# Patient Record
Sex: Female | Born: 1975 | Race: White | Hispanic: No | Marital: Married | Smoking: Former smoker
Health system: Southern US, Community
[De-identification: ages and names within clinical notes are randomized; demographics above are authoritative.]

## PROBLEM LIST (undated history)

## (undated) HISTORY — PX: REPLACEMENT TOTAL KNEE: SUR1224

---

## 1997-06-10 ENCOUNTER — Ambulatory Visit (HOSPITAL_BASED_OUTPATIENT_CLINIC_OR_DEPARTMENT_OTHER): Admission: RE | Admit: 1997-06-10 | Discharge: 1997-06-10 | Payer: Self-pay | Admitting: Orthopedic Surgery

## 1998-04-01 ENCOUNTER — Emergency Department (HOSPITAL_COMMUNITY): Admission: EM | Admit: 1998-04-01 | Discharge: 1998-04-01 | Payer: Self-pay | Admitting: Emergency Medicine

## 1998-04-01 ENCOUNTER — Encounter: Payer: Self-pay | Admitting: Emergency Medicine

## 1999-05-04 ENCOUNTER — Encounter: Payer: Self-pay | Admitting: Surgery

## 1999-05-05 ENCOUNTER — Inpatient Hospital Stay (HOSPITAL_COMMUNITY): Admission: EM | Admit: 1999-05-05 | Discharge: 1999-05-05 | Payer: Self-pay | Admitting: Emergency Medicine

## 1999-10-30 ENCOUNTER — Other Ambulatory Visit: Admission: RE | Admit: 1999-10-30 | Discharge: 1999-10-30 | Payer: Self-pay | Admitting: *Deleted

## 2000-09-09 ENCOUNTER — Other Ambulatory Visit: Admission: RE | Admit: 2000-09-09 | Discharge: 2000-09-09 | Payer: Self-pay | Admitting: *Deleted

## 2001-12-10 ENCOUNTER — Other Ambulatory Visit: Admission: RE | Admit: 2001-12-10 | Discharge: 2001-12-10 | Payer: Self-pay | Admitting: *Deleted

## 2001-12-12 ENCOUNTER — Encounter: Payer: Self-pay | Admitting: Family Medicine

## 2001-12-12 ENCOUNTER — Encounter: Admission: RE | Admit: 2001-12-12 | Discharge: 2001-12-12 | Payer: Self-pay | Admitting: Family Medicine

## 2002-01-13 ENCOUNTER — Ambulatory Visit (HOSPITAL_COMMUNITY): Admission: RE | Admit: 2002-01-13 | Discharge: 2002-01-13 | Payer: Self-pay | Admitting: Gastroenterology

## 2002-01-13 ENCOUNTER — Encounter: Payer: Self-pay | Admitting: Gastroenterology

## 2002-02-09 ENCOUNTER — Encounter: Payer: Self-pay | Admitting: Family Medicine

## 2002-02-09 ENCOUNTER — Encounter: Admission: RE | Admit: 2002-02-09 | Discharge: 2002-02-09 | Payer: Self-pay | Admitting: Family Medicine

## 2002-02-11 ENCOUNTER — Ambulatory Visit (HOSPITAL_COMMUNITY): Admission: RE | Admit: 2002-02-11 | Discharge: 2002-02-11 | Payer: Self-pay | Admitting: Gastroenterology

## 2002-02-11 ENCOUNTER — Encounter (INDEPENDENT_AMBULATORY_CARE_PROVIDER_SITE_OTHER): Payer: Self-pay | Admitting: *Deleted

## 2002-08-27 ENCOUNTER — Encounter: Payer: Self-pay | Admitting: Surgery

## 2002-08-27 ENCOUNTER — Encounter (INDEPENDENT_AMBULATORY_CARE_PROVIDER_SITE_OTHER): Payer: Self-pay

## 2002-08-27 ENCOUNTER — Observation Stay (HOSPITAL_COMMUNITY): Admission: RE | Admit: 2002-08-27 | Discharge: 2002-08-28 | Payer: Self-pay | Admitting: Surgery

## 2003-06-04 ENCOUNTER — Other Ambulatory Visit: Admission: RE | Admit: 2003-06-04 | Discharge: 2003-06-04 | Payer: Self-pay | Admitting: Family Medicine

## 2004-01-23 ENCOUNTER — Inpatient Hospital Stay (HOSPITAL_COMMUNITY): Admission: AD | Admit: 2004-01-23 | Discharge: 2004-01-23 | Payer: Self-pay | Admitting: *Deleted

## 2004-08-25 ENCOUNTER — Inpatient Hospital Stay (HOSPITAL_COMMUNITY): Admission: RE | Admit: 2004-08-25 | Discharge: 2004-08-28 | Payer: Self-pay | Admitting: Obstetrics and Gynecology

## 2009-11-16 ENCOUNTER — Inpatient Hospital Stay (HOSPITAL_COMMUNITY)
Admission: AD | Admit: 2009-11-16 | Discharge: 2009-11-16 | Payer: Self-pay | Source: Home / Self Care | Admitting: Obstetrics and Gynecology

## 2010-04-12 LAB — WET PREP, GENITAL
Clue Cells Wet Prep HPF POC: NONE SEEN
Trich, Wet Prep: NONE SEEN
Yeast Wet Prep HPF POC: NONE SEEN

## 2010-04-12 LAB — ABO/RH: ABO/RH(D): O POS

## 2010-04-12 LAB — GC/CHLAMYDIA PROBE AMP, GENITAL
Chlamydia, DNA Probe: NEGATIVE
GC Probe Amp, Genital: NEGATIVE

## 2010-04-12 LAB — URINALYSIS, ROUTINE W REFLEX MICROSCOPIC
Bilirubin Urine: NEGATIVE
Glucose, UA: NEGATIVE mg/dL
Hgb urine dipstick: NEGATIVE
Ketones, ur: NEGATIVE mg/dL
Nitrite: NEGATIVE
Protein, ur: NEGATIVE mg/dL
Specific Gravity, Urine: <1.005 — ABNORMAL LOW (ref 1.005–1.030)
Urobilinogen, UA: 0.2 mg/dL (ref 0.0–1.0)
pH: 7 (ref 5.0–8.0)

## 2010-04-12 LAB — CBC
HCT: 41 % (ref 36.0–46.0)
Hemoglobin: 13.9 g/dL (ref 12.0–15.0)
MCH: 29.7 pg (ref 26.0–34.0)
MCHC: 34 g/dL (ref 30.0–36.0)
MCV: 87.4 fL (ref 78.0–100.0)
Platelets: 262 10*3/uL (ref 150–400)
RBC: 4.69 MIL/uL (ref 3.87–5.11)
RDW: 13.4 % (ref 11.5–15.5)
WBC: 9.8 10*3/uL (ref 4.0–10.5)

## 2010-04-12 LAB — HCG, QUANTITATIVE, PREGNANCY: hCG, Beta Chain, Quant, S: 16535 m[IU]/mL — ABNORMAL HIGH (ref <5)

## 2010-04-12 LAB — POCT PREGNANCY, URINE: Preg Test, Ur: POSITIVE

## 2010-04-26 ENCOUNTER — Observation Stay (HOSPITAL_COMMUNITY)
Admission: AD | Admit: 2010-04-26 | Discharge: 2010-04-26 | Disposition: A | Discharge status: Home / Self Care | Payer: Medicaid Other | Source: Ambulatory Visit | Attending: Obstetrics and Gynecology | Admitting: Obstetrics and Gynecology

## 2010-04-26 DIAGNOSIS — Y92410 Unspecified street and highway as the place of occurrence of the external cause: Secondary | ICD-10-CM | POA: Insufficient documentation

## 2010-04-26 DIAGNOSIS — O99891 Other specified diseases and conditions complicating pregnancy: Principal | ICD-10-CM | POA: Insufficient documentation

## 2010-05-03 ENCOUNTER — Encounter: Payer: Medicaid Other | Attending: Obstetrics and Gynecology

## 2010-05-03 DIAGNOSIS — Z713 Dietary counseling and surveillance: Secondary | ICD-10-CM | POA: Insufficient documentation

## 2010-05-03 DIAGNOSIS — O99810 Abnormal glucose complicating pregnancy: Secondary | ICD-10-CM | POA: Insufficient documentation

## 2010-06-16 NOTE — Op Note (Signed)
NAME:  Denise Cross, Denise Cross                      ACCOUNT NO.:  [id number]   MEDICAL RECORD NO.:  [id number]                   PATIENT TYPE:  AMB   LOCATION:  DAY                                  FACILITY:  [facility]   PHYSICIAN:  Abigail Miyamoto, M.D.              DATE OF BIRTH:  08/26/1975   DATE OF PROCEDURE:  08/27/2002  DATE OF DISCHARGE:                                 OPERATIVE REPORT   PREOPERATIVE DIAGNOSES:  1. Biliary dyskinesia.  2. Right ovarian cyst.   POSTOPERATIVE DIAGNOSES:  1. Biliary dyskinesia.  2. Benign-appearing right ovary.   PROCEDURE:  1. Diagnostic laparoscopy.  2. Laparoscopic cholecystectomy with intraoperative cholangiogram.   SURGEON:  Douglas A. Magnus Ivan, M.D.   ANESTHESIA:  General endotracheal.   ESTIMATED BLOOD LOSS:  Minimal.   INDICATIONS:  Denise Cross is a 35 year old female, who has been  experiencing right upper quadrant abdominal pain and nausea and vomiting.  She has had a work-up which reveals her to have biliary dyskinesia.  She has  also had  history of ovarian cysts in the past and recently had a large 4 cm  right ovarian cyst.  Therefore, the decision was made to proceed to the  operating room for diagnostic laparoscopy and cholecystectomy.   FINDINGS:  The patient was found to have both a normal-appearing right and  left ovary with a normal-appearing uterus and no scar tissue or  abnormalities in the pelvis.  The cholangiogram was normal.   PROCEDURE IN DETAIL:  The patient was brought to the operating room and  identified as Denise Cross.  She was placed supine on the operating  table, and general anesthesia was induced.  Her abdomen was then prepped and  draped in the usual sterile fashion.  Using a #15 blade, a small transverse  incision was made below the umbilicus.  The incision was carried down  through the fascia with a scalpel.  Hemostat was then used to pass through  the peritoneal cavity.  Next, a 0 Vicryl  pursestring suture was placed  around the fascial opening.  The Hasson port was placed through the opening,  and insufflation of the abdomen was begun.  Next, a 12 mm port was placed in  the patient's epigastrium, and two 5 mm ports were placed in the patient's  right flank under direct vision.  The patient was then placed in the  Trendelenburg position, and the pelvis was examined.  The patient's uterus  appeared normal.  The right ovary was the elevated and closely examined.  Very tiny corpus luteal cysts were identified but no large ovarian cyst was  identified on any part of the right ovary.  The left ovary was also examined  and was normal as well.  The adnexa also appeared normal.  There was no scar  tissue seen in the pelvis.  At this point, the patient was placed back in  the correct position for  cholecystectomy.  The gallbladder was identified,  and multiple adhesions to the gallbladder were taken down bluntly.  The  cystic duct was then identified and clipped once distally and then partly  opened with laparoscopic scissors.  A cholangiocatheter was then inserted  into the right upper quadrant through an angiocatheter.  The  cholangiocatheter was then placed into the opening of the cystic duct.  A  cholangiogram was then performed with contrast under direct fluoroscopy.  Good flow of contrast was seen into the entire biliary system and duodenum.  The cholangiocatheter was then removed.  The cystic duct was then clipped  three times proximally and transected with the scissors.  The cystic artery  was identified and clipped several times proximally and distally before  being transected as well.  The gallbladder was then slowly dissected free  from the liver bed with the electrocautery.  Once the gallbladder was freed  from the liver bed, it was pulled out through the incision at the umbilicus.  The liver bed was again examined, and hemostasis appeared to be achieved.  The abdomen was  then irrigated with normal saline.  All ports were then  removed under direct vision, and the abdomen was deflated.  All incisions  were then anesthetized with 0.25% Marcaine and then closed with 4-0 Monocryl  subcuticular sutures.  Steri-Strips, gauze, and tape were then applied.  The  patient tolerated the procedure well.  All sponge, needle, and instrument  counts were correct at the end of the procedure.  The patient was then  extubated in the operating room and taken in stable condition to the  recovery room.                                               Abigail Miyamoto, M.D.    DB/MEDQ  D:  08/27/2002  T:  08/27/2002  Job:  [id number]   cc:   Lenoard Aden, M.D.  [address], [address]  [place]  [place] [address]  Fax: [phone]   Anselmo Rod, M.D.  [address]  [address]  [place]  [place] [address]  Fax: [phone]

## 2010-06-16 NOTE — Op Note (Signed)
NAMEROBERT, Denise Cross NO.:  [id number]   MEDICAL RECORD NO.:  [id number]          PATIENT TYPE:  INP   LOCATION:  9199                          FACILITY:  [facility]   PHYSICIAN:  Lenoard Aden, M.D.DATE OF BIRTH:  1975-04-01   DATE OF PROCEDURE:  08/25/2004  DATE OF DISCHARGE:                                 OPERATIVE REPORT   PREOPERATIVE DIAGNOSIS:  38-week intrauterine pregnancy, polyhydramnios,  presumed macrosomia.   POSTOPERATIVE DIAGNOSIS:  38-week intrauterine pregnancy, polyhydramnios,  presumed macrosomia.   PROCEDURE:  Primary low transverse cesarean section.   SURGEON:  Lenoard Aden, M.D.   ASSISTANT:  Pershing Cox, M.D.   ESTIMATED BLOOD LOSS:  1000 mL.   COMPLICATIONS:  None.   DRAINS:  Foley.   COUNTS:  Correct.   DISPOSITION:  The patient to recovery room in good condition.   FINDINGS:  Full term living female, Apgars 9 and 9, 10 pounds 12 ounces.  Normal tubes and normal ovaries.   DESCRIPTION OF PROCEDURE:  After being apprised of the risks of anesthesia,  infection, bleeding, injury to abdominal organs with need for repair, the  patient is brought to the operating room where she is administered a spinal  anesthetic without complications and prepped and draped in the usual sterile  fashion.  Foley catheter placed.  After achieving adequate anesthesia,  dilute Marcaine solution was placed in the area of Pfannenstiel skin  incision, made with a scalpel and carried down to the fascia which was  nicked in the midline and opened transversely using Mayo scissors.  The  rectus muscles were dissected sharply in the midline.  The peritoneum was  entered sharply and bladder blade placed.  Visceroperitoneum was scored in a  smile-like fashion and dissected sharply off the lower uterine segment.  Kerr hysterotomy incision was made.  Vacuum assisted delivery of a 10 pound  12 ounce female, handed to pediatricians in attendance.  Apgars 8  and 9.  Cord  blood collected.  Placenta delivered manually and intact.  Three-vessel cord  noted.  Uterus exteriorized, curetted using a dry laparoscopy pack, and  closed in two layers using 0 Monocryl suture.  Uterine atony managed with  the use of Methergine and dilute Pitocin solution.  Normal tubes and ovaries  noted.  Uterus is closed in two layers using a 0 Monocryl suture.  The  bladder flap was inspected and found to be hemostatic.  Irrigation  accomplished.  Blood clots subsequently removed.  After achieving  good hemostasis, the fascia is closed using a 0 Monocryl in continuous  running fashion and subcutaneous tissue closed using a 0 plain running  suture.  Skin closed using staples.  The patient tolerated the procedure  well and was transferred to recovery room in good condition.       RJT/MEDQ  D:  08/25/2004  T:  08/25/2004  Job:  [id number]

## 2010-06-16 NOTE — H&P (Signed)
   NAME:  Denise Cross, Denise Cross                      ACCOUNT NO.:  [id number]   MEDICAL RECORD NO.:  [id number]                   PATIENT TYPE:  AMB   LOCATION:  DAY                                  FACILITY:  [facility]   PHYSICIAN:  Lenoard Aden, M.D.             DATE OF BIRTH:  1975/05/17   DATE OF ADMISSION:  08/27/2002  DATE OF DISCHARGE:                                HISTORY & PHYSICAL   CHIEF COMPLAINT:  Right lower quadrant pain with known ovarian cyst.   HISTORY OF PRESENT ILLNESS:  The patient is a 35 year old white female, G1,  P0, who presents with a history of right lower quadrant pain and desire for  surgical intervention.  She is scheduled for a laparoscopic cholecystectomy  and wishes to have her right ovarian mass addressed at the time.   PAST MEDICAL HISTORY:  She has a history of knee surgery x5, history of  asthma previously, previous history of a ruptured ovarian cyst.   MEDICATIONS:  Intermittent pain medication use with previous use of Motrin,  Percocet and a nasal inhaler.   HABITS:  She is a smoker, one pack a day.   SOCIAL HISTORY:  She denies domestic or physical violence.   FAMILY HISTORY:  Family history of diabetes, cardiac disease, myocardial  infarction and hypertension.   PHYSICAL EXAMINATION:  GENERAL:  She is a well-developed, well-nourished  white female in no acute distress.  HEENT:  Normal.  LUNGS:  Lungs clear.  HEART:  Regular rhythm.  ABDOMEN:  Abdomen soft and nontender.  PELVIC:  Reveals a right adnexal mass which is mobile and tender upon  palpation, normal size uterus and no left adnexal masses are appreciated.   IMPRESSION:  Persistent and symptomatic right ovarian cyst, questionably  simple in nature.   PLAN:  The plan is to proceed with diagnostic laparoscopy at the time of  laparoscopic cholecystectomy, probable right ovarian cystectomy.  The risks  of anesthesia, infection, bowel and bladder injury discussed.  Dr. Abigail Miyamoto to dictate separate note regarding laparoscopic cholecystectomy.                                               Lenoard Aden, M.D.    RJT/MEDQ  D:  08/26/2002  T:  08/27/2002  Job:  [id number]

## 2010-06-16 NOTE — Discharge Summary (Signed)
NAMETAKYRA, CANTRALL NO.:  [id number]   MEDICAL RECORD NO.:  [id number]          PATIENT TYPE:  INP   LOCATION:  9134                          FACILITY:  [facility]   PHYSICIAN:  Lenoard Aden, M.D.DATE OF BIRTH:  19-Feb-1975   DATE OF ADMISSION:  08/25/2004  DATE OF DISCHARGE:  08/28/2004                                 DISCHARGE SUMMARY   Patient underwent uncomplicated primary cesarean section at 38 weeks for  severe polyhydramnios and presumed macrosomia of a 10 pound 12 ounce fetus.  Uncomplicated cesarean section performed.  Postoperative course  uncomplicated.  Hemoglobin 9.9.  Tolerated regular diet well.  Discharged to  home day three.  Discharge medications to include Tylox and Chromagen Forte.  Discharge teaching done.  Follow up in the office four to six weeks.      Lenoard Aden, M.D.  Electronically Signed     RJT/MEDQ  D:  10/15/2004  T:  10/16/2004  Job:  [id number]

## 2010-06-16 NOTE — Op Note (Signed)
   NAME:  Denise Cross, Denise Cross                      ACCOUNT NO.:  [id number]   MEDICAL RECORD NO.:  [id number]                   PATIENT TYPE:  AMB   LOCATION:  ENDO                                 FACILITY:  [facility]   PHYSICIAN:  Anselmo Rod, M.D.               DATE OF BIRTH:  1975-05-02   DATE OF PROCEDURE:  02/11/2002  DATE OF DISCHARGE:                                 OPERATIVE REPORT   DATE OF BIRTH:  [birth year].   PROCEDURE PERFORMED:  Colonoscopy with snare polypectomy times one.   ENDOSCOPIST:  Anselmo Rod, M.D.   INSTRUMENT USED:  Olympus video colonoscope.   INDICATIONS FOR PROCEDURE:  The patient is a 35 year-old white female with a  family history of colon cancer.  Rule out colon polyps or masses.   PRE-PROCEDURE PREPARATION:  Informed consent was procured from the patient.  The patient fasted for eight hours prior to the procedure and prepped with a  bottle of magnesium citrate and a gallon of NuLytely the night prior to the  procedure.   PRE-PROCEDURE PHYSICAL:  VITAL SIGNS: The patient had stable vital signs.  NECK:  Supple.  CHEST:  Clear to auscultation.  CARDIAC:  S1 and S2 are regular.  ABDOMEN:  Soft with normal bowel sounds.   DESCRIPTION OF PROCEDURE:  The patient was placed in the left lateral  decubitus position and sedated with 100 mg of Demerol and 10 mg of Versed  intravenously.  Once the patient was adequate sedated and maintained on low  flow oxygen and continuous cardiac monitoring, the Olympus video colonoscope  was advanced from the rectum to the cecum and advanced without difficulty.  A small sessile polyp was snared from 40 cm.  The colonic mucosa to the  terminal ileum appeared normal.  Retroflexion revealed no acute  abnormalities.   IMPRESSION:  1. Normal colonoscopy up to the terminal ileum except for a small sessile     polyp snared at 40 cm.    RECOMMENDATIONS:  1. Await pathology results.  2. Outpatient follow-up in  the next two weeks with further recommendations.  3. Avoid all non-steroidals including aspirin for now.                                               Anselmo Rod, M.D.    JNM/MEDQ  D:  02/11/2002  T:  02/11/2002  Job:  [id number]   cc:   Christella Noa, M.D.  [address] [place]  [place], [place] [address]  Fax: [phone]   LESLIE VAN DYKE

## 2010-06-28 ENCOUNTER — Other Ambulatory Visit: Payer: Self-pay | Admitting: Obstetrics and Gynecology

## 2010-06-28 ENCOUNTER — Encounter (HOSPITAL_COMMUNITY): Payer: Medicaid Other | Attending: Obstetrics and Gynecology

## 2010-06-28 DIAGNOSIS — Z01818 Encounter for other preprocedural examination: Secondary | ICD-10-CM | POA: Insufficient documentation

## 2010-06-28 DIAGNOSIS — Z01812 Encounter for preprocedural laboratory examination: Secondary | ICD-10-CM | POA: Insufficient documentation

## 2010-06-28 LAB — CBC
HCT: 37 % (ref 36.0–46.0)
MCH: 29.7 pg (ref 26.0–34.0)
MCHC: 34.1 g/dL (ref 30.0–36.0)
MCV: 87.3 fL (ref 78.0–100.0)
Platelets: 185 10*3/uL (ref 150–400)
RBC: 4.24 MIL/uL (ref 3.87–5.11)
RDW: 13.8 % (ref 11.5–15.5)
WBC: 8.7 10*3/uL (ref 4.0–10.5)

## 2010-06-28 LAB — BASIC METABOLIC PANEL
BUN: 7 mg/dL (ref 6–23)
CO2: 23 mEq/L (ref 19–32)
Calcium: 9.3 mg/dL (ref 8.4–10.5)
Chloride: 105 mEq/L (ref 96–112)
Creatinine, Ser: <0.47 mg/dL (ref 0.4–1.2)
Potassium: 3.8 mEq/L (ref 3.5–5.1)
Sodium: 136 mEq/L (ref 135–145)

## 2010-06-28 LAB — SURGICAL PCR SCREEN
MRSA, PCR: NEGATIVE
Staphylococcus aureus: POSITIVE — AB

## 2010-06-28 LAB — RPR: RPR Ser Ql: NONREACTIVE

## 2010-07-05 ENCOUNTER — Inpatient Hospital Stay (HOSPITAL_COMMUNITY)
Admission: RE | Admit: 2010-07-05 | Payer: Medicaid Other | Source: Ambulatory Visit | Admitting: Obstetrics and Gynecology

## 2010-07-13 ENCOUNTER — Inpatient Hospital Stay (HOSPITAL_COMMUNITY)
Admission: EM | Admit: 2010-07-13 | Discharge: 2010-07-16 | DRG: 766 | Disposition: A | Discharge status: Home / Self Care | Payer: Medicaid Other | Source: Ambulatory Visit | Attending: Obstetrics and Gynecology | Admitting: Obstetrics and Gynecology

## 2010-07-13 ENCOUNTER — Other Ambulatory Visit: Payer: Self-pay | Admitting: Obstetrics and Gynecology

## 2010-07-13 DIAGNOSIS — O99814 Abnormal glucose complicating childbirth: Secondary | ICD-10-CM | POA: Diagnosis present

## 2010-07-13 DIAGNOSIS — Z01818 Encounter for other preprocedural examination: Secondary | ICD-10-CM

## 2010-07-13 DIAGNOSIS — Z01812 Encounter for preprocedural laboratory examination: Secondary | ICD-10-CM

## 2010-07-13 DIAGNOSIS — O3660X0 Maternal care for excessive fetal growth, unspecified trimester, not applicable or unspecified: Secondary | ICD-10-CM | POA: Diagnosis present

## 2010-07-13 DIAGNOSIS — O34219 Maternal care for unspecified type scar from previous cesarean delivery: Principal | ICD-10-CM | POA: Diagnosis present

## 2010-07-13 LAB — TYPE AND SCREEN: Antibody Screen: NEGATIVE

## 2010-07-13 LAB — GLUCOSE, CAPILLARY
Glucose-Capillary: 100 mg/dL — ABNORMAL HIGH (ref 70–99)
Glucose-Capillary: 88 mg/dL (ref 70–99)

## 2010-07-14 LAB — CBC
HCT: 31.5 % — ABNORMAL LOW (ref 36.0–46.0)
Hemoglobin: 10.5 g/dL — ABNORMAL LOW (ref 12.0–15.0)
MCHC: 33.3 g/dL (ref 30.0–36.0)
MCV: 88.5 fL (ref 78.0–100.0)
Platelets: 152 10*3/uL (ref 150–400)
RBC: 3.56 MIL/uL — ABNORMAL LOW (ref 3.87–5.11)
RDW: 13.9 % (ref 11.5–15.5)

## 2010-07-14 NOTE — H&P (Signed)
Denise Cross, Denise NO.:  [id number]  MEDICAL RECORD NO.:  [id number]  LOCATION:  910A                          FACILITY:  [facility]  PHYSICIAN:  Huel Cote, M.D. DATE OF BIRTH:  05-12-1975  DATE OF ADMISSION:  07/13/2010 DATE OF DISCHARGE:                             HISTORY & PHYSICAL   HISTORY OF PRESENT ILLNESS:  The patient is a 35 year old G3, P 1-0-1-1 who is coming in at 74 and 2/7th weeks gestation for a scheduled repeat cesarean section given a prior C-section.  Her estimated due date is July 11, 2010 based on her LMP consistent with a first trimester ultrasound.  The patient's prenatal care has been significant for gestational diabetes which has been very well controlled on glyburide p.o.  She also has a history of a macrosomic infant who was delivered by C-section with her last pregnancy with an EFW of over 10 pounds and indeed weight is 10-1/2 pounds at birth at 38 weeks.  The patient had really wanted to attempt VBAC this pregnancy and has been followed closely.  Unfortunately her cervix remains completely unfavorable at 40 weeks and an estimated fetal weight at this juncture is over 9 pounds at the 90th percentile.  Given these findings, the patient has agreed that since she cannot be safely induced from an unfavorable cervix, she wishes to proceed with a repeat cesarean section at this time.  PAST OBSTETRICAL HISTORY:  As stated, she had a prior low transverse cesarean section of a 10 pounds 12 ounces infant in 2006 and a prior miscarriage in 2002.  PAST MEDICAL HISTORY: 1. Significant for anxiety and depression for which she requires no     medication. 2. She had a remote history of an abnormal Pap smear in 2009.     However, Pap smear performed in November 2011 with this pregnancy     was within normal limits. 3. She has a history of arthritis and hypertension.  However, her     blood pressures have been well maintained in this  pregnancy. 4. She also has a history of ulcer disease years ago.  PAST SURGICAL HISTORY: 1. Significant for the cesarean section as stated. 2. Cholecystectomy. 3. Three ACL repairs on her left knee and 2 ACL repairs on her right     knee as well as a tonsillectomy.  ALLERGIES:  She has no known drug allergies and no latex allergies.  SOCIAL HISTORY:  She is married and a homemaker.  She denies any alcohol, tobacco or drug use.  MEDICATIONS: 1. Glyburide 2.5 mg p.o. q.a.m. and 5 mg p.o. q.p.m. 2. She also is on prenatal vitamins and Tylenol p.r.n.  PHYSICAL EXAMINATION:  VITAL SIGNS:  Her blood pressure is 130/75. CARDIAC:  Regular rate and rhythm. LUNGS:  Clear. ABDOMEN:  Gravid and nontender with a fundal height of 42.  Cervix is fingertip at 30 and -3 station. EXTREMITIES:  She has positive edema in her lower extremities.  ASSESSMENT AND PLAN:  The patient was counseled as the risks and benefits of cesarean section including bleeding, infection and possible damage to bowel and bladder.  She understands these risks and though she had initially wish to  proceed with the VBAC given that her cervix still remains to unfavorable for induction.  She would rather proceed with a cesarean section, also given that the baby's estimated fetal weight is over 9 pounds.  She is anxious to go ahead and deliver.  Her prenatal labs are as follows; O+ antibody negative, rubella immune, hepatitis B surface antigen negative, RPR nonreactive, HIV negative, GC negative, Chlamydia negative, cystic fibrosis negative.  She had a normal first trimester screen and a normal AFP.  She had a normal anatomy ultrasound performed.  She also tested positive for group B strep.  Her 1-hour Glucola was 226, so she was advised that she could not perform the 3-hour and simply begin diet and fingersticks which she elected to do.  These were initially elevated on the diet.  However, with the addition of p.o.  glyburide, the patient has had excellent glucose control taking 2.5 in the morning and 5 at night.  She will take her evening dose of glyburide the day prior to surgery and skip her morning dose the day of surgery.     Huel Cote, M.D.     KR/MEDQ  D:  07/12/2010  T:  07/12/2010  Job:  [id number]  Electronically Signed by Huel Cote M.D. on 07/14/2010 12:46:50 AM

## 2010-07-19 ENCOUNTER — Ambulatory Visit (HOSPITAL_COMMUNITY)
Admission: RE | Admit: 2010-07-19 | Discharge: 2010-07-19 | Disposition: A | Discharge status: Home / Self Care | Payer: Medicaid Other | Source: Ambulatory Visit | Attending: Obstetrics and Gynecology | Admitting: Obstetrics and Gynecology

## 2010-07-25 NOTE — Op Note (Signed)
NAMELANE, KJOS NO.:  [id number]  MEDICAL RECORD NO.:  [id number]  LOCATION:  9107                          FACILITY:  [facility]  PHYSICIAN:  Huel Cote, M.D. DATE OF BIRTH:  [birth year]  DATE OF PROCEDURE:  07/13/2010 DATE OF DISCHARGE:                              OPERATIVE REPORT   PREOPERATIVE DIAGNOSES: 1. Term pregnancy at 39 weeks, delivered. 2. Gestational diabetes. 3. Prior cesarean section, declines vaginal birth after cesarean     (section). 4. Macrosomia.  POSTOPERATIVE DIAGNOSES: 1. Term pregnancy at 39 weeks, delivered. 2. Gestational diabetes. 3. Prior cesarean section, declines vaginal birth after cesarean     (section). 4. Macrosomia.  PROCEDURE:  Repeat low-transverse C-section with two-layer closure of uterus.  SURGEON:  Huel Cote, MD  ASSISTANT:  Sherron Monday, MD  ANESTHESIA:  Spinal.  FINDINGS:  There was a viable female infant in the vertex presentation, Apgars were 9 and 9, weight was 10 pounds and 11 ounces.  There were normal uterus, tubes, and ovaries noted.  SPECIMEN:  Placenta was sent to L and D.  ESTIMATED BLOOD LOSS:  700 mL.  URINE OUTPUT:  100 mL, clear urine.  IV FLUIDS:  2500 mL LR.  COMPLICATIONS:  There were no known complications.  PROCEDURE:  The patient was taken to the operating room where spinal anesthesia was obtained without difficulty.  She was then prepped and draped in normal sterile fashion in dorsal supine position with a leftward tilt.  A Pfannenstiel skin incision was then made slightly above a preexisting incision due to its proximity to the mons and some significant scarring from a prior wound infection, this was then carried down to the layer of fascia by sharp dissection and Bovie cautery.  The fascia was nicked in the midline and the incision was extended laterally with Mayo scissors.  The inferior aspect was grasped with Kocher clamps, elevated and dissected off the  underlying rectus muscles, superior aspect likewise was dissected off the rectus muscles.  These were separated in the midline and the peritoneal cavity entered bluntly.  The peritoneal incision was then extended both superiorly and inferiorly with careful attention to avoid both bowel and bladder.  The Alexis self- retaining wound retractor was then placed within the incision and the lower uterine segment was exposed nicely.  The bladder flap was created with Metzenbaum scissors and the lower uterine segment was incised in the transverse fashion.  The cavity itself was entered bluntly.  The infant's head was delivered up to the incision and could not quite be delivered.  Therefore, a gentle vacuum assistance was performed with the vacuum in the green zone and the vertex was readily delivered.  Nose and mouth were bulb suctioned.  The remainder of the body delivered without difficulty and the cord was clamped and cut and the infant was taken to the waiting pediatricians.  The uterus had then spontaneous expression of the placenta and was handed off to L and D.  The uterus was cleared of all clots and debris with a moist lap sponge and the uterine incision was then repaired in two layers, the first in a running locked layer of 1-0 chromic and  the second imbricating layer of the same suture.  There was no active bleeding noted at this point.  The tubes and ovaries were inspected and found to be normal.  The gutters were cleared of all clots and debris, and again the uterine incision appeared to be hemostatic. The Alexis retractor was removed and the peritoneum and rectus muscles were reapproximated with several interrupted sutures of 2-0 Vicryl. Finally, the subfascial planes were inspected and found to be hemostatic and the fascia was then closed with 0 Vicryl in a running fashion.  The subcutaneous tissue was closed with a running suture of 2-0 plain and the skin was closed with 4-0 Vicryl  on a subcuticular stitch.  Again, all instrument and sponge counts were correct and the patient was taken to the recovery room in good condition.     Huel Cote, M.D.     KR/MEDQ  D:  07/13/2010  T:  07/14/2010  Job:  [id number]  Electronically Signed by Huel Cote M.D. on 07/25/2010 09:09:16 AM

## 2010-07-26 NOTE — Discharge Summary (Signed)
  NAMENAHDIA, DOUCET NO.:  [id number]  MEDICAL RECORD NO.:  [id number]  LOCATION:  9107                          FACILITY:  [facility]  PHYSICIAN:  Sherron Monday, MD        DATE OF BIRTH:  1975/06/06  DATE OF ADMISSION:  07/13/2010 DATE OF DISCHARGE:  07/16/2010                              DISCHARGE SUMMARY   ADMITTING DIAGNOSIS:  Intrauterine pregnancy at term for scheduled repeat cesarean section.  DISCHARGE DIAGNOSIS:  Intrauterine pregnancy at term for scheduled repeat cesarean section.  PROCEDURE:  Repeat low-transverse cesarean section.  HISTORY OF PRESENT ILLNESS:  For the full history and physical, please see the dictated note.  However, in brief, a 35 year old G3, P1-0-1-1, at 4+ weeks for a scheduled repeat cesarean section.  This was performed without complication on July 13, 2010, delivering a viable female infant with Apgars of 9 and 9 and a weight of 10 pounds 11 ounces.  Normal uterus, tubes, and ovaries were noted.  EBL was 700 mL.  Her postpartum course was relatively uncomplicated.  She remained afebrile.  Vital signs stable throughout.  Her hemoglobin decreased to 10.5.  She was discharged home on postoperative day #3.  At this time, she was ambulating, voiding, tolerating regular diet.  She was discharged home with routine discharge instructions and numbers to call with any questions or problems.  She voiced understanding of this.  She was also given prescriptions for Motrin, Percocet, and prenatal vitamins.  She will follow up in the office in approximately 2 weeks.     Sherron Monday, MD     JB/MEDQ  D:  07/16/2010  T:  07/16/2010  Job:  [id number]  Electronically Signed by Sherron Monday MD on 07/26/2010 01:06:40 PM

## 2011-01-12 ENCOUNTER — Other Ambulatory Visit (HOSPITAL_COMMUNITY): Payer: Self-pay | Admitting: Obstetrics and Gynecology

## 2011-01-12 DIAGNOSIS — Z30431 Encounter for routine checking of intrauterine contraceptive device: Secondary | ICD-10-CM

## 2011-01-26 ENCOUNTER — Ambulatory Visit (HOSPITAL_COMMUNITY)
Admission: RE | Admit: 2011-01-26 | Discharge: 2011-01-26 | Disposition: A | Discharge status: Home / Self Care | Payer: Medicaid Other | Source: Ambulatory Visit | Attending: Obstetrics and Gynecology | Admitting: Obstetrics and Gynecology

## 2011-01-26 DIAGNOSIS — N83209 Unspecified ovarian cyst, unspecified side: Secondary | ICD-10-CM | POA: Insufficient documentation

## 2011-01-26 DIAGNOSIS — Z30431 Encounter for routine checking of intrauterine contraceptive device: Secondary | ICD-10-CM | POA: Insufficient documentation

## 2011-09-03 ENCOUNTER — Encounter (HOSPITAL_BASED_OUTPATIENT_CLINIC_OR_DEPARTMENT_OTHER): Payer: Self-pay | Admitting: Emergency Medicine

## 2011-09-03 ENCOUNTER — Emergency Department (HOSPITAL_BASED_OUTPATIENT_CLINIC_OR_DEPARTMENT_OTHER)
Admission: EM | Admit: 2011-09-03 | Discharge: 2011-09-04 | Disposition: A | Discharge status: Home / Self Care | Payer: Medicaid Other | Attending: Emergency Medicine | Admitting: Emergency Medicine

## 2011-09-03 DIAGNOSIS — T622X1A Toxic effect of other ingested (parts of) plant(s), accidental (unintentional), initial encounter: Secondary | ICD-10-CM | POA: Insufficient documentation

## 2011-09-03 DIAGNOSIS — L255 Unspecified contact dermatitis due to plants, except food: Secondary | ICD-10-CM | POA: Insufficient documentation

## 2011-09-03 DIAGNOSIS — L237 Allergic contact dermatitis due to plants, except food: Secondary | ICD-10-CM

## 2011-09-03 MED ORDER — PREDNISONE 20 MG PO TABS: 20.0000 mg | ORAL_TABLET | Freq: Every day | ORAL | Status: DC

## 2011-09-03 NOTE — ED Notes (Signed)
Multiple areas of itchy red raised areas for several days that she reports is getting worse.

## 2014-04-08 ENCOUNTER — Encounter (HOSPITAL_BASED_OUTPATIENT_CLINIC_OR_DEPARTMENT_OTHER): Payer: Self-pay | Admitting: *Deleted

## 2014-04-08 ENCOUNTER — Emergency Department (HOSPITAL_BASED_OUTPATIENT_CLINIC_OR_DEPARTMENT_OTHER)
Admission: EM | Admit: 2014-04-08 | Discharge: 2014-04-09 | Disposition: A | Discharge status: Home / Self Care | Payer: Medicaid Other | Attending: Emergency Medicine | Admitting: Emergency Medicine

## 2014-04-08 ENCOUNTER — Emergency Department (HOSPITAL_BASED_OUTPATIENT_CLINIC_OR_DEPARTMENT_OTHER): Payer: Medicaid Other

## 2014-04-08 DIAGNOSIS — H9319 Tinnitus, unspecified ear: Secondary | ICD-10-CM | POA: Insufficient documentation

## 2014-04-08 DIAGNOSIS — Z7952 Long term (current) use of systemic steroids: Secondary | ICD-10-CM | POA: Insufficient documentation

## 2014-04-08 DIAGNOSIS — R0981 Nasal congestion: Secondary | ICD-10-CM | POA: Insufficient documentation

## 2014-04-08 DIAGNOSIS — Z87891 Personal history of nicotine dependence: Secondary | ICD-10-CM | POA: Insufficient documentation

## 2014-04-08 DIAGNOSIS — R42 Dizziness and giddiness: Secondary | ICD-10-CM | POA: Insufficient documentation

## 2014-04-08 DIAGNOSIS — Z3202 Encounter for pregnancy test, result negative: Secondary | ICD-10-CM | POA: Insufficient documentation

## 2014-04-08 LAB — URINE MICROSCOPIC-ADD ON

## 2014-04-08 LAB — URINALYSIS, ROUTINE W REFLEX MICROSCOPIC
BILIRUBIN URINE: NEGATIVE
GLUCOSE, UA: NEGATIVE mg/dL
Hgb urine dipstick: NEGATIVE
Ketones, ur: NEGATIVE mg/dL
Nitrite: NEGATIVE
Protein, ur: NEGATIVE mg/dL
Specific Gravity, Urine: 1.014 (ref 1.005–1.030)
Urobilinogen, UA: 0.2 mg/dL (ref 0.0–1.0)
pH: 6 (ref 5.0–8.0)

## 2014-04-08 LAB — PREGNANCY, URINE: Preg Test, Ur: NEGATIVE

## 2014-04-08 MED ORDER — MECLIZINE HCL 25 MG PO TABS
25.0000 mg | ORAL_TABLET | Freq: Once | ORAL | Status: AC
  Administered 2014-04-08: 25 mg via ORAL
  Filled 2014-04-08: qty 1

## 2014-04-08 NOTE — ED Notes (Signed)
Pt sts she was trying to get of her car at apprx. 17:30 when she became dizzy. Pt sts she has some tingling in her left arm. Grips are equal, no facial droop or slurred speech noted.

## 2014-04-08 NOTE — ED Provider Notes (Signed)
CSN: [id number]     Arrival date & time 04/08/14  1850 History  This chart was scribed for Cleopatra Sardo, MD by Annye Asa, ED Scribe. This patient was seen in room MH11/MH11 and the patient's care was started at 11:09 PM.    Chief Complaint  Patient presents with  . Dizziness   Patient is a 39 y.o. female presenting with dizziness. The history is provided by the patient. No language interpreter was used.  Dizziness Quality:  Vertigo Severity:  Moderate Onset quality:  Sudden Duration:  6 hours Timing:  Constant Progression:  Improving Chronicity:  New Context: head movement   Relieved by:  Nothing Worsened by:  Movement (bending down) Ineffective treatments:  None tried Associated symptoms: tinnitus   Associated symptoms: no headaches, no hearing loss, no vomiting and no weakness   Associated symptoms comment:  Nasal congestion Risk factors: no hx of vertigo      HPI Comments: Denise Cross is a 39 y.o. female who presents to the Emergency Department complaining of sudden onset lightheadedness and balance issues around 17:30 tonight. Patient reports that she constantly feels as though she is "tilting leftward" and she must "hold herself to the right, or she will pass out." Symptoms began as she was trying to get out of the car; they continued for approximately two hours and have gradually improved since. Dizziness is worse with movement, particularly bending down. Patient notes an instance of tinnitus lasting 2-3 minutes last night; no tinnitus today. She denies recent ear infection, otalgia, vomiting. No treatments or medications tried PTA.   Patient reports that her menses are irregular with her IUD. No PCP at this time.   History reviewed. No pertinent past medical history. Past Surgical History  Procedure Laterality Date  . Replacement total knee     No family history on file. History  Substance Use Topics  . Smoking status: Former Games developer  . Smokeless tobacco: Not on  file  . Alcohol Use: No   OB History    No data available     Review of Systems  HENT: Positive for congestion and tinnitus. Negative for ear pain and hearing loss.   Gastrointestinal: Negative for vomiting.  Neurological: Positive for dizziness. Negative for facial asymmetry, speech difficulty, weakness, numbness and headaches.  All other systems reviewed and are negative.  Allergies  Review of patient's allergies indicates no known allergies.  Home Medications   Prior to Admission medications   Medication Sig Start Date End Date Taking? Authorizing Provider  HYDROcodone-acetaminophen (NORCO/VICODIN) 5-325 MG per tablet Take 1 tablet by mouth as needed.    Historical Provider, MD  predniSONE (DELTASONE) 20 MG tablet Take 1 tablet (20 mg total) by mouth daily. 09/03/11   Alexandros Ewan, MD   BP 131/78 mmHg  Pulse 81  Temp(Src) 98.7 F (37.1 C) (Oral)  Resp 18  Ht  (1.676 m)  Wt 245 lb (111.131 kg)  BMI 39.56 kg/m2  SpO2 100% Physical Exam  Constitutional: She is oriented to person, place, and time. She appears well-developed and well-nourished. No distress.  HENT:  Head: Normocephalic and atraumatic.  Mouth/Throat: Oropharynx is clear and moist. No oropharyngeal exudate.  Moist mucous membranes. Both TMs normal.   Eyes: EOM are normal. Pupils are equal, round, and reactive to light.  No rotatory or horizontal nystagmus   Neck: Normal range of motion. Neck supple. No JVD present.  No lymph nodes.   Cardiovascular: Normal rate, regular rhythm and normal heart sounds.  Exam reveals no gallop and no friction rub.   No murmur heard. Pulmonary/Chest: Effort normal and breath sounds normal. No respiratory distress. She has no wheezes. She has no rales.  Abdominal: Soft. Bowel sounds are normal. She exhibits no mass. There is no tenderness. There is no rebound and no guarding.  Musculoskeletal: Normal range of motion. She exhibits no edema.  Moves all extremities normally.    Lymphadenopathy:    She has no cervical adenopathy.  Neurological: She is alert and oriented to person, place, and time. She has normal reflexes. She displays normal reflexes. No cranial nerve deficit. She exhibits normal muscle tone. Coordination normal.  Cranial nerves 2-12 intact. Grip strength 5/5 in BUEs and BLEs. Babinski's down going.    Skin: Skin is warm and dry. No rash noted.  Psychiatric: She has a normal mood and affect. Her behavior is normal.  Nursing note and vitals reviewed.   ED Course  Procedures   DIAGNOSTIC STUDIES: Oxygen Saturation is 100% on RA, normal by my interpretation.    COORDINATION OF CARE: 11:15 PM Discussed treatment plan with pt at bedside and pt agreed to plan.   Labs Review Labs Reviewed - No data to display  Imaging Review No results found.   EKG Interpretation None      MDM   Final diagnoses:  None   Symptoms with head movement and tinnitus consistent with peripheral vertigo symptoms resolved with meclizine normal head ct follow up with your PMD strict return precautions given.     I personally performed the services described in this documentation, which was scribed in my presence. The recorded information has been reviewed and is accurate.       Cy BlamerApril Talyia Allende, MD 04/09/14 [phone]

## 2014-04-09 ENCOUNTER — Encounter (HOSPITAL_BASED_OUTPATIENT_CLINIC_OR_DEPARTMENT_OTHER): Payer: Self-pay | Admitting: Emergency Medicine

## 2014-04-09 LAB — BASIC METABOLIC PANEL
Anion gap: 7 (ref 5–15)
BUN: 11 mg/dL (ref 6–23)
CO2: 25 mmol/L (ref 19–32)
Calcium: 8.6 mg/dL (ref 8.4–10.5)
Chloride: 106 mmol/L (ref 96–112)
Creatinine, Ser: 0.54 mg/dL (ref 0.50–1.10)
GFR calc non Af Amer: >90 mL/min (ref >90)
Glucose, Bld: 97 mg/dL (ref 70–99)
POTASSIUM: 3.4 mmol/L — AB (ref 3.5–5.1)
Sodium: 138 mmol/L (ref 135–145)

## 2014-04-09 LAB — CBC WITH DIFFERENTIAL/PLATELET
BASOS ABS: 0 10*3/uL (ref 0.0–0.1)
Basophils Relative: 0 % (ref 0–1)
EOS ABS: 0.2 10*3/uL (ref 0.0–0.7)
EOS PCT: 2 % (ref 0–5)
HCT: 39.7 % (ref 36.0–46.0)
HEMOGLOBIN: 13.6 g/dL (ref 12.0–15.0)
Lymphocytes Relative: 33 % (ref 12–46)
Lymphs Abs: 3.4 10*3/uL (ref 0.7–4.0)
MCH: 29.9 pg (ref 26.0–34.0)
MCHC: 34.3 g/dL (ref 30.0–36.0)
MCV: 87.3 fL (ref 78.0–100.0)
Monocytes Absolute: 0.8 10*3/uL (ref 0.1–1.0)
Monocytes Relative: 7 % (ref 3–12)
Neutro Abs: 6 10*3/uL (ref 1.7–7.7)
Neutrophils Relative %: 58 % (ref 43–77)
Platelets: 231 10*3/uL (ref 150–400)
RBC: 4.55 MIL/uL (ref 3.87–5.11)
RDW: 12.2 % (ref 11.5–15.5)
WBC: 10.4 10*3/uL (ref 4.0–10.5)

## 2014-04-09 MED ORDER — MECLIZINE HCL 12.5 MG PO TABS: 12.5000 mg | ORAL_TABLET | Freq: Three times a day (TID) | ORAL | Status: DC | PRN

## 2014-04-09 NOTE — Discharge Instructions (Signed)
Benign Positional Vertigo Vertigo means you feel like you or your surroundings are moving when they are not. Benign positional vertigo is the most common form of vertigo. Benign means that the cause of your condition is not serious. Benign positional vertigo is more common in older adults. CAUSES  Benign positional vertigo is the result of an upset in the labyrinth system. This is an area in the middle ear that helps control your balance. This may be caused by a viral infection, head injury, or repetitive motion. However, often no specific cause is found. SYMPTOMS  Symptoms of benign positional vertigo occur when you move your head or eyes in different directions. Some of the symptoms may include:  Loss of balance and falls.  Vomiting.  Blurred vision.  Dizziness.  Nausea.  Involuntary eye movements (nystagmus). DIAGNOSIS  Benign positional vertigo is usually diagnosed by physical exam. If the specific cause of your benign positional vertigo is unknown, your caregiver may perform imaging tests, such as magnetic resonance imaging (MRI) or computed tomography (CT). TREATMENT  Your caregiver may recommend movements or procedures to correct the benign positional vertigo. Medicines such as meclizine, benzodiazepines, and medicines for nausea may be used to treat your symptoms. In rare cases, if your symptoms are caused by certain conditions that affect the inner ear, you may need surgery. HOME CARE INSTRUCTIONS   Follow your caregiver's instructions.  Move slowly. Do not make sudden body or head movements.  Avoid driving.  Avoid operating heavy machinery.  Avoid performing any tasks that would be dangerous to you or others during a vertigo episode.  Drink enough fluids to keep your urine clear or pale yellow. SEEK IMMEDIATE MEDICAL CARE IF:   You develop problems with walking, weakness, numbness, or using your arms, hands, or legs.  You have difficulty speaking.  You develop  severe headaches.  Your nausea or vomiting continues or gets worse.  You develop visual changes.  Your family or friends notice any behavioral changes.  Your condition gets worse.  You have a fever.  You develop a stiff neck or sensitivity to light. MAKE SURE YOU:   Understand these instructions.  Will watch your condition.  Will get help right away if you are not doing well or get worse. Document Released: 10/23/2005 Document Revised: 04/09/2011 Document Reviewed: 10/05/2010 Exit[employer] Patient Information 2015 [employer]. This information is not intended to replace advice given to you by your health care provider. Make sure you discuss any questions you have with your health care provider.    

## 2014-04-09 NOTE — ED Notes (Signed)
Denies dizziness, "feel better", VSS.

## 2016-03-08 ENCOUNTER — Emergency Department (HOSPITAL_BASED_OUTPATIENT_CLINIC_OR_DEPARTMENT_OTHER): Payer: Self-pay

## 2016-03-08 ENCOUNTER — Encounter (HOSPITAL_BASED_OUTPATIENT_CLINIC_OR_DEPARTMENT_OTHER): Payer: Self-pay | Admitting: *Deleted

## 2016-03-08 ENCOUNTER — Emergency Department (HOSPITAL_BASED_OUTPATIENT_CLINIC_OR_DEPARTMENT_OTHER)
Admission: EM | Admit: 2016-03-08 | Discharge: 2016-03-08 | Disposition: A | Discharge status: Home / Self Care | Payer: Self-pay | Attending: Emergency Medicine | Admitting: Emergency Medicine

## 2016-03-08 DIAGNOSIS — M791 Myalgia: Secondary | ICD-10-CM | POA: Insufficient documentation

## 2016-03-08 DIAGNOSIS — R0981 Nasal congestion: Secondary | ICD-10-CM | POA: Insufficient documentation

## 2016-03-08 DIAGNOSIS — R509 Fever, unspecified: Secondary | ICD-10-CM | POA: Insufficient documentation

## 2016-03-08 DIAGNOSIS — R05 Cough: Secondary | ICD-10-CM | POA: Insufficient documentation

## 2016-03-08 DIAGNOSIS — Z87891 Personal history of nicotine dependence: Secondary | ICD-10-CM | POA: Insufficient documentation

## 2016-03-08 DIAGNOSIS — Z79899 Other long term (current) drug therapy: Secondary | ICD-10-CM | POA: Insufficient documentation

## 2016-03-08 DIAGNOSIS — R6889 Other general symptoms and signs: Secondary | ICD-10-CM

## 2016-03-08 MED ORDER — OSELTAMIVIR PHOSPHATE 75 MG PO CAPS: 75.0000 mg | ORAL_CAPSULE | Freq: Two times a day (BID) | ORAL | 0 refills | Status: DC

## 2016-03-08 MED FILL — OSELTAMIVIR PHOS 75 MG CAP: 75 | 5 days supply | Qty: 10 | Fill #0

## 2016-03-08 NOTE — ED Notes (Signed)
ED Provider at bedside. 

## 2016-03-08 NOTE — ED Triage Notes (Signed)
Pt c/o flu-like symptoms x3 days

## 2016-03-08 NOTE — ED Provider Notes (Signed)
[facility]   CSN: [id number] Arrival date & time: 03/08/16  8295     History   Chief Complaint Chief Complaint  Patient presents with  . Influenza    HPI Denise Cross is a 41 y.o. female.  Patient with onset of fever yesterday cough congestion body aches. No nausea vomiting or diarrhea. No known sick contacts. Patient states that things head are very hard and quickly.      History reviewed. No pertinent past medical history.  There are no active problems to display for this patient.   Past Surgical History:  Procedure Laterality Date  . REPLACEMENT TOTAL KNEE      OB History    No data available       Home Medications    Prior to Admission medications   Medication Sig Start Date End Date Taking? Authorizing Provider  acetaminophen (TYLENOL) 325 MG tablet Take 1,000 mg by mouth every 6 (six) hours as needed.   Yes Historical Provider, MD  ibuprofen (ADVIL,MOTRIN) 600 MG tablet Take 600 mg by mouth every 6 (six) hours as needed.   Yes Historical Provider, MD  HYDROcodone-acetaminophen (NORCO/VICODIN) 5-325 MG per tablet Take 1 tablet by mouth as needed.    Historical Provider, MD  oseltamivir (TAMIFLU) 75 MG capsule Take 1 capsule (75 mg total) by mouth every 12 (twelve) hours. 03/08/16   Vanetta Mulders, MD    Family History No family history on file.  Social History Social History  Substance Use Topics  . Smoking status: Former Games developer  . Smokeless tobacco: Not on file  . Alcohol use No     Allergies   Patient has no known allergies.   Review of Systems Review of Systems  Constitutional: Positive for fever.  HENT: Positive for congestion.   Eyes: Negative for redness.  Respiratory: Positive for cough.   Cardiovascular: Negative for chest pain.  Gastrointestinal: Negative for abdominal pain.  Genitourinary: Negative for dysuria.  Musculoskeletal: Positive for myalgias.  Skin: Negative for rash.  Neurological:  Negative for headaches.  Psychiatric/Behavioral: Negative for confusion.     Physical Exam Updated Vital Signs BP 138/95 (BP Location: Right Arm)   Pulse 71   Temp 100.1 F (37.8 C) (Oral)   Resp 22   Ht 5\' 6"  (1.676 m)   Wt 120.2 kg   SpO2 97%   BMI 42.77 kg/m   Physical Exam  Constitutional: She is oriented to person, place, and time. She appears well-developed and well-nourished. No distress.  HENT:  Head: Normocephalic and atraumatic.  Mouth/Throat: Oropharynx is clear and moist.  Eyes: EOM are normal. Pupils are equal, round, and reactive to light.  Neck: Normal range of motion. Neck supple.  Cardiovascular: Normal rate, regular rhythm and normal heart sounds.   Pulmonary/Chest: Effort normal and breath sounds normal. She has no wheezes.  Abdominal: Bowel sounds are normal.  Musculoskeletal: Normal range of motion.  Neurological: She is oriented to person, place, and time. No cranial nerve deficit or sensory deficit. She exhibits normal muscle tone. Coordination normal.  Skin: Skin is warm.  Nursing note and vitals reviewed.    ED Treatments / Results  Labs (all labs ordered are listed, but only abnormal results are displayed) Labs Reviewed - No data to display  EKG  EKG Interpretation None       Radiology Dg Chest 2 View  Result Date: 03/08/2016 CLINICAL DATA:  Cough, fever, congestion, and body aches. EXAM: CHEST  2 VIEW COMPARISON:  None. FINDINGS: The heart size and mediastinal contours are within normal limits. Both lungs are clear. The visualized skeletal structures are unremarkable. IMPRESSION: No active cardiopulmonary disease. Electronically Signed   By: Elsie StainJohn T Curnes M.D.   On: 03/08/2016 08:28    Procedures Procedures (including critical care time)  Medications Ordered in ED Medications - No data to display   Initial Impression / Assessment and Plan / ED Course  I have reviewed the triage vital signs and the nursing notes.  Pertinent labs  & imaging results that were available during my care of the patient were reviewed by me and considered in my medical decision making (see chart for details).     Patient nontoxic no acute distress. Symptoms consistent with the flu. Chest x-ray negative for pneumonia. Patient's symptoms just started yesterday so in the window for Tamiflu.  Final Clinical Impressions(s) / ED Diagnoses   Final diagnoses:  Flu-like symptoms    New Prescriptions New Prescriptions   OSELTAMIVIR (TAMIFLU) 75 MG CAPSULE    Take 1 capsule (75 mg total) by mouth every 12 (twelve) hours.     Vanetta MuldersScott Shonya Sumida, MD 03/08/16 [phone]

## 2016-03-08 NOTE — Discharge Instructions (Signed)
Chest x-ray negative for pneumonia. Symptoms consistent with the the flu. Take Tamiflu as directed. Return for any new or worse symptoms. Work note provided. Otherwise recommend over-the-counter cold and flu medicines.

## 2016-05-26 ENCOUNTER — Emergency Department (HOSPITAL_BASED_OUTPATIENT_CLINIC_OR_DEPARTMENT_OTHER): Payer: Self-pay

## 2016-05-26 ENCOUNTER — Emergency Department (HOSPITAL_BASED_OUTPATIENT_CLINIC_OR_DEPARTMENT_OTHER)
Admission: EM | Admit: 2016-05-26 | Discharge: 2016-05-26 | Disposition: A | Discharge status: Home / Self Care | Payer: Self-pay | Attending: Emergency Medicine | Admitting: Emergency Medicine

## 2016-05-26 ENCOUNTER — Encounter (HOSPITAL_BASED_OUTPATIENT_CLINIC_OR_DEPARTMENT_OTHER): Payer: Self-pay | Admitting: Emergency Medicine

## 2016-05-26 DIAGNOSIS — Z87891 Personal history of nicotine dependence: Secondary | ICD-10-CM | POA: Insufficient documentation

## 2016-05-26 DIAGNOSIS — Z79899 Other long term (current) drug therapy: Secondary | ICD-10-CM | POA: Insufficient documentation

## 2016-05-26 DIAGNOSIS — R0602 Shortness of breath: Secondary | ICD-10-CM | POA: Insufficient documentation

## 2016-05-26 DIAGNOSIS — J020 Streptococcal pharyngitis: Secondary | ICD-10-CM | POA: Insufficient documentation

## 2016-05-26 DIAGNOSIS — R0789 Other chest pain: Secondary | ICD-10-CM | POA: Insufficient documentation

## 2016-05-26 LAB — BASIC METABOLIC PANEL
Anion gap: 8 (ref 5–15)
BUN: 11 mg/dL (ref 6–20)
CHLORIDE: 105 mmol/L (ref 101–111)
CO2: 24 mmol/L (ref 22–32)
Calcium: 8.8 mg/dL — ABNORMAL LOW (ref 8.9–10.3)
Creatinine, Ser: 0.57 mg/dL (ref 0.44–1.00)
GFR calc Af Amer: >60 mL/min (ref >60)
GFR calc non Af Amer: >60 mL/min (ref >60)
Glucose, Bld: 95 mg/dL (ref 65–99)
Potassium: 3.6 mmol/L (ref 3.5–5.1)
Sodium: 137 mmol/L (ref 135–145)

## 2016-05-26 LAB — CBC
HEMATOCRIT: 41.5 % (ref 36.0–46.0)
Hemoglobin: 14.2 g/dL (ref 12.0–15.0)
MCH: 30.1 pg (ref 26.0–34.0)
MCHC: 34.2 g/dL (ref 30.0–36.0)
MCV: 87.9 fL (ref 78.0–100.0)
Platelets: 262 10*3/uL (ref 150–400)
RBC: 4.72 MIL/uL (ref 3.87–5.11)
RDW: 12.3 % (ref 11.5–15.5)
WBC: 8.4 10*3/uL (ref 4.0–10.5)

## 2016-05-26 LAB — CBG MONITORING, ED: Glucose-Capillary: 84 mg/dL (ref 65–99)

## 2016-05-26 LAB — TROPONIN I

## 2016-05-26 MED ORDER — NAPROXEN 500 MG PO TABS: 500.0000 mg | ORAL_TABLET | Freq: Two times a day (BID) | ORAL | 0 refills | Status: AC

## 2016-05-26 MED ORDER — DEXAMETHASONE 6 MG PO TABS
10.0000 mg | ORAL_TABLET | Freq: Once | ORAL | Status: AC
  Administered 2016-05-26: 10 mg via ORAL
  Filled 2016-05-26: qty 1

## 2016-05-26 MED ORDER — HYDROCODONE-ACETAMINOPHEN 5-325 MG PO TABS: 1.0000 | ORAL_TABLET | Freq: Four times a day (QID) | ORAL | 0 refills | Status: AC | PRN

## 2016-05-26 MED ORDER — AMOXICILLIN-POT CLAVULANATE 875-125 MG PO TABS: 1.0000 | ORAL_TABLET | Freq: Two times a day (BID) | ORAL | 0 refills | Status: AC

## 2016-05-26 MED ORDER — CYCLOBENZAPRINE HCL 10 MG PO TABS: 10.0000 mg | ORAL_TABLET | Freq: Three times a day (TID) | ORAL | 0 refills | Status: AC | PRN

## 2016-05-26 MED ORDER — ASPIRIN 81 MG PO CHEW
324.0000 mg | CHEWABLE_TABLET | Freq: Once | ORAL | Status: AC
  Administered 2016-05-26: 324 mg via ORAL
  Filled 2016-05-26: qty 4

## 2016-05-26 MED ORDER — KETOROLAC TROMETHAMINE 60 MG/2ML IM SOLN
60.0000 mg | Freq: Once | INTRAMUSCULAR | Status: AC
  Administered 2016-05-26: 60 mg via INTRAMUSCULAR
  Filled 2016-05-26: qty 2

## 2016-05-26 NOTE — ED Notes (Signed)
Patient transported to X-ray 

## 2016-05-26 NOTE — ED Provider Notes (Signed)
[facility]   CSN: [id number] Arrival date & time: 05/26/16  2045  By signing my name below, I, Linna Darner, attest that this documentation has been prepared under the direction and in the presence of physician practitioner, Shaune Pollack, MD. Electronically Signed: Linna Darner, Scribe. 05/26/2016. 9:42 PM.  History   Chief Complaint Chief Complaint  Patient presents with  . Chest Pain    The history is provided by the patient. No language interpreter was used.     HPI Comments: Denise Cross is a 41 y.o. female who presents to the Emergency Department complaining of constant, sharp, central chest pain beginning last night at 8 PM while sitting prior to eating dinner. She states the pain is worse with movement (specifically leaning forward or backward), applied pressure to her central chest, and respiration. Pt notes her CP is not modified post-prandially. She reports some associated shortness of breath. Pt has been resting today without any improvement of her chest pain, but notes she lifted items and twisted frequently at work yesterday and believes her CP could be a result of this. She notes some recent nasal congestion and a sore throat as well. Patient was recently evaluated at an Urgent Care for her sore throat and was diagnosed with strep throat; she was discharged with amoxicillin BID and has taken it as instructed. Her son is sick with strep throat but she has no other known ill contacts. Patient reports she takes ibuprofen and acetaminophen daily because she is on her feet all day at work and has a h/o pain in her lower extremities. No regular prescription medications. Her brother and father both had heart attacks in their 35's. No additional pertinent cardiac history. She denies cough, abdominal pain, or any other associated symptoms.  History reviewed. No pertinent past medical history.  There are no active problems to display for this  patient.   Past Surgical History:  Procedure Laterality Date  . REPLACEMENT TOTAL KNEE      OB History    No data available       Home Medications    Prior to Admission medications   Medication Sig Start Date End Date Taking? Authorizing Provider  acetaminophen (TYLENOL) 325 MG tablet Take 1,000 mg by mouth every 6 (six) hours as needed.   Yes Historical Provider, MD  amoxicillin (AMOXIL) 500 MG tablet Take 500 mg by mouth 2 (two) times daily.   Yes Historical Provider, MD  ibuprofen (ADVIL,MOTRIN) 600 MG tablet Take 600 mg by mouth every 6 (six) hours as needed.   Yes Historical Provider, MD  amoxicillin-clavulanate (AUGMENTIN) 875-125 MG tablet Take 1 tablet by mouth every 12 (twelve) hours. 05/26/16 06/05/16  Shaune Pollack, MD  cyclobenzaprine (FLEXERIL) 10 MG tablet Take 1 tablet (10 mg total) by mouth 3 (three) times daily as needed for muscle spasms. 05/26/16   Shaune Pollack, MD  HYDROcodone-acetaminophen (NORCO/VICODIN) 5-325 MG tablet Take 1-2 tablets by mouth every 6 (six) hours as needed for severe pain. 05/26/16   Shaune Pollack, MD  naproxen (NAPROSYN) 500 MG tablet Take 1 tablet (500 mg total) by mouth 2 (two) times daily with a meal. 05/26/16 06/02/16  Shaune Pollack, MD    Family History No family history on file.  Social History Social History  Substance Use Topics  . Smoking status: Former Games developer  . Smokeless tobacco: Never Used  . Alcohol use No     Allergies   Patient has no known allergies.   Review  of Systems Review of Systems  Constitutional: Negative for chills and fever.  HENT: Positive for congestion and sore throat. Negative for rhinorrhea.   Eyes: Negative for visual disturbance.  Respiratory: Positive for shortness of breath. Negative for cough and wheezing.   Cardiovascular: Positive for chest pain. Negative for leg swelling.  Gastrointestinal: Negative for abdominal pain, diarrhea, nausea and vomiting.  Genitourinary: Negative for dysuria,  flank pain, vaginal bleeding and vaginal discharge.  Musculoskeletal: Negative for neck pain.  Skin: Negative for rash.  Allergic/Immunologic: Negative for immunocompromised state.  Neurological: Negative for syncope and headaches.  Hematological: Does not bruise/bleed easily.  All other systems reviewed and are negative.  Physical Exam Updated Vital Signs BP (!) 136/98 (BP Location: Right Arm)   Pulse 74   Temp 98.1 F (36.7 C) (Oral)   Resp 15   Ht  (1.676 m)   Wt 270 lb (122.5 kg)   SpO2 97%   BMI 43.58 kg/m   Physical Exam  Constitutional: She is oriented to person, place, and time. She appears well-developed and well-nourished. No distress.  HENT:  Head: Normocephalic and atraumatic.  Moderate posterior pharyngeal erythema with some exudates along tonsillar areas and uvula. OP widely patent. TMs with serous effusions bilaterally.  Eyes: Conjunctivae are normal.  Neck: Neck supple.  Cardiovascular: Normal rate, regular rhythm and normal heart sounds.  Exam reveals no friction rub.   No murmur heard. Pulmonary/Chest: Effort normal and breath sounds normal. No respiratory distress. She has no wheezes. She has no rales. She exhibits tenderness (marked TTP over anterior mid sternum and assocaited intercostal spaces; no bruising or deformity).  Abdominal: Soft. She exhibits no distension.  Musculoskeletal: She exhibits no edema.  Neurological: She is alert and oriented to person, place, and time. She exhibits normal muscle tone.  Skin: Skin is warm. Capillary refill takes less than 2 seconds.  Psychiatric: She has a normal mood and affect.  Nursing note and vitals reviewed.  ED Treatments / Results  Labs (all labs ordered are listed, but only abnormal results are displayed) Labs Reviewed  BASIC METABOLIC PANEL - Abnormal; Notable for the following:       Result Value   Calcium 8.8 (*)    All other components within normal limits  TROPONIN I  CBC  CBG MONITORING, ED     EKG  EKG Interpretation  Date/Time:  Saturday May 26 2016 20:54:46 EDT Ventricular Rate:  92 PR Interval:  122 QRS Duration: 90 QT Interval:  346 QTC Calculation: 427 R Axis:   76 Text Interpretation:  Normal sinus rhythm Normal ECG No old tracing to compare Confirmed by Luda Charbonneau MD, Kayleah Appleyard [phone]) on 05/27/2016 12:11:54 AM       Radiology Dg Chest 2 View  Result Date: 05/26/2016 CLINICAL DATA:  Chest pain since yesterday EXAM: CHEST  2 VIEW COMPARISON:  03/08/2016 FINDINGS: The heart size and mediastinal contours are within normal limits. Both lungs are clear. The visualized skeletal structures are unremarkable. IMPRESSION: No active cardiopulmonary disease. Electronically Signed   By: Tollie Eth M.D.   On: 05/26/2016 21:50    Procedures Procedures (including critical care time)  DIAGNOSTIC STUDIES: Oxygen Saturation is 100% on RA, normal by my interpretation.    COORDINATION OF CARE: 9:58 PM Discussed treatment plan with pt at bedside and pt agreed to plan.  Medications Ordered in ED Medications  aspirin chewable tablet 324 mg (324 mg Oral Given 05/26/16 2106)  dexamethasone (DECADRON) tablet 10 mg (10 mg Oral  Given 05/26/16 2209)  ketorolac (TORADOL) injection 60 mg (60 mg Intramuscular Given 05/26/16 2213)     Initial Impression / Assessment and Plan / ED Course  I have reviewed the triage vital signs and the nursing notes.  Pertinent labs & imaging results that were available during my care of the patient were reviewed by me and considered in my medical decision making (see chart for details).    41 yo F with PMHx as above here with atypical, positional chest pain. I suspect this is 2/2 costochondritis/MSK chest wall pain as it is highly positional, sharp, and entirely reproduced on exam. Her EKG is non-ischemic, and trop negative despite sx present >12 hours - doubt ACS. No signs of PE and she is not tachycardic or hypoxic, no LE edema. No RUQ TTP or sx to  suggest cholecystitis. She has no EKG or clinical evidence of pericarditis or myocarditis. CXR is clear. Will treat with NSAIDs, flexeril. Regarding her tonsillar exudates, she does have h/o recent strep exposure and worsening sx on amox - will broaden to augmentin. No fevers or signs of sepsis. No evidence of PTA, RPA, meningitis, or encephalitis.  Final Clinical Impressions(s) / ED Diagnoses   Final diagnoses:  Chest wall pain  Strep pharyngitis    New Prescriptions Discharge Medication List as of 05/26/2016 10:41 PM    START taking these medications   Details  amoxicillin-clavulanate (AUGMENTIN) 875-125 MG tablet Take 1 tablet by mouth every 12 (twelve) hours., Starting Sat 05/26/2016, Until Tue 06/05/2016, Print    cyclobenzaprine (FLEXERIL) 10 MG tablet Take 1 tablet (10 mg total) by mouth 3 (three) times daily as needed for muscle spasms., Starting Sat 05/26/2016, Print    naproxen (NAPROSYN) 500 MG tablet Take 1 tablet (500 mg total) by mouth 2 (two) times daily with a meal., Starting Sat 05/26/2016, Until Sat 06/02/2016, Print       I personally performed the services described in this documentation, which was scribed in my presence. The recorded information has been reviewed and is accurate.     Shaune Pollack, MD 05/27/16 [phone]

## 2016-05-26 NOTE — ED Triage Notes (Signed)
Pt reports mid upper chest pain since yesterday.  Pt reports a "soreness that is worse with movement." along with SOB.  Pt states she is being treated for strep since earlier this week.  Pt brother and sister have been treated for strokes and pt is feeling very anxious about possible health concerns.

## 2018-10-12 IMAGING — CR DG CHEST 2V
2 series · 2 of 2 positions shown · non-contrast
Comparison: 03/08/2016

CLINICAL DATA: Chest pain since yesterday

EXAM:
CHEST  2 VIEW

[w chest pa]
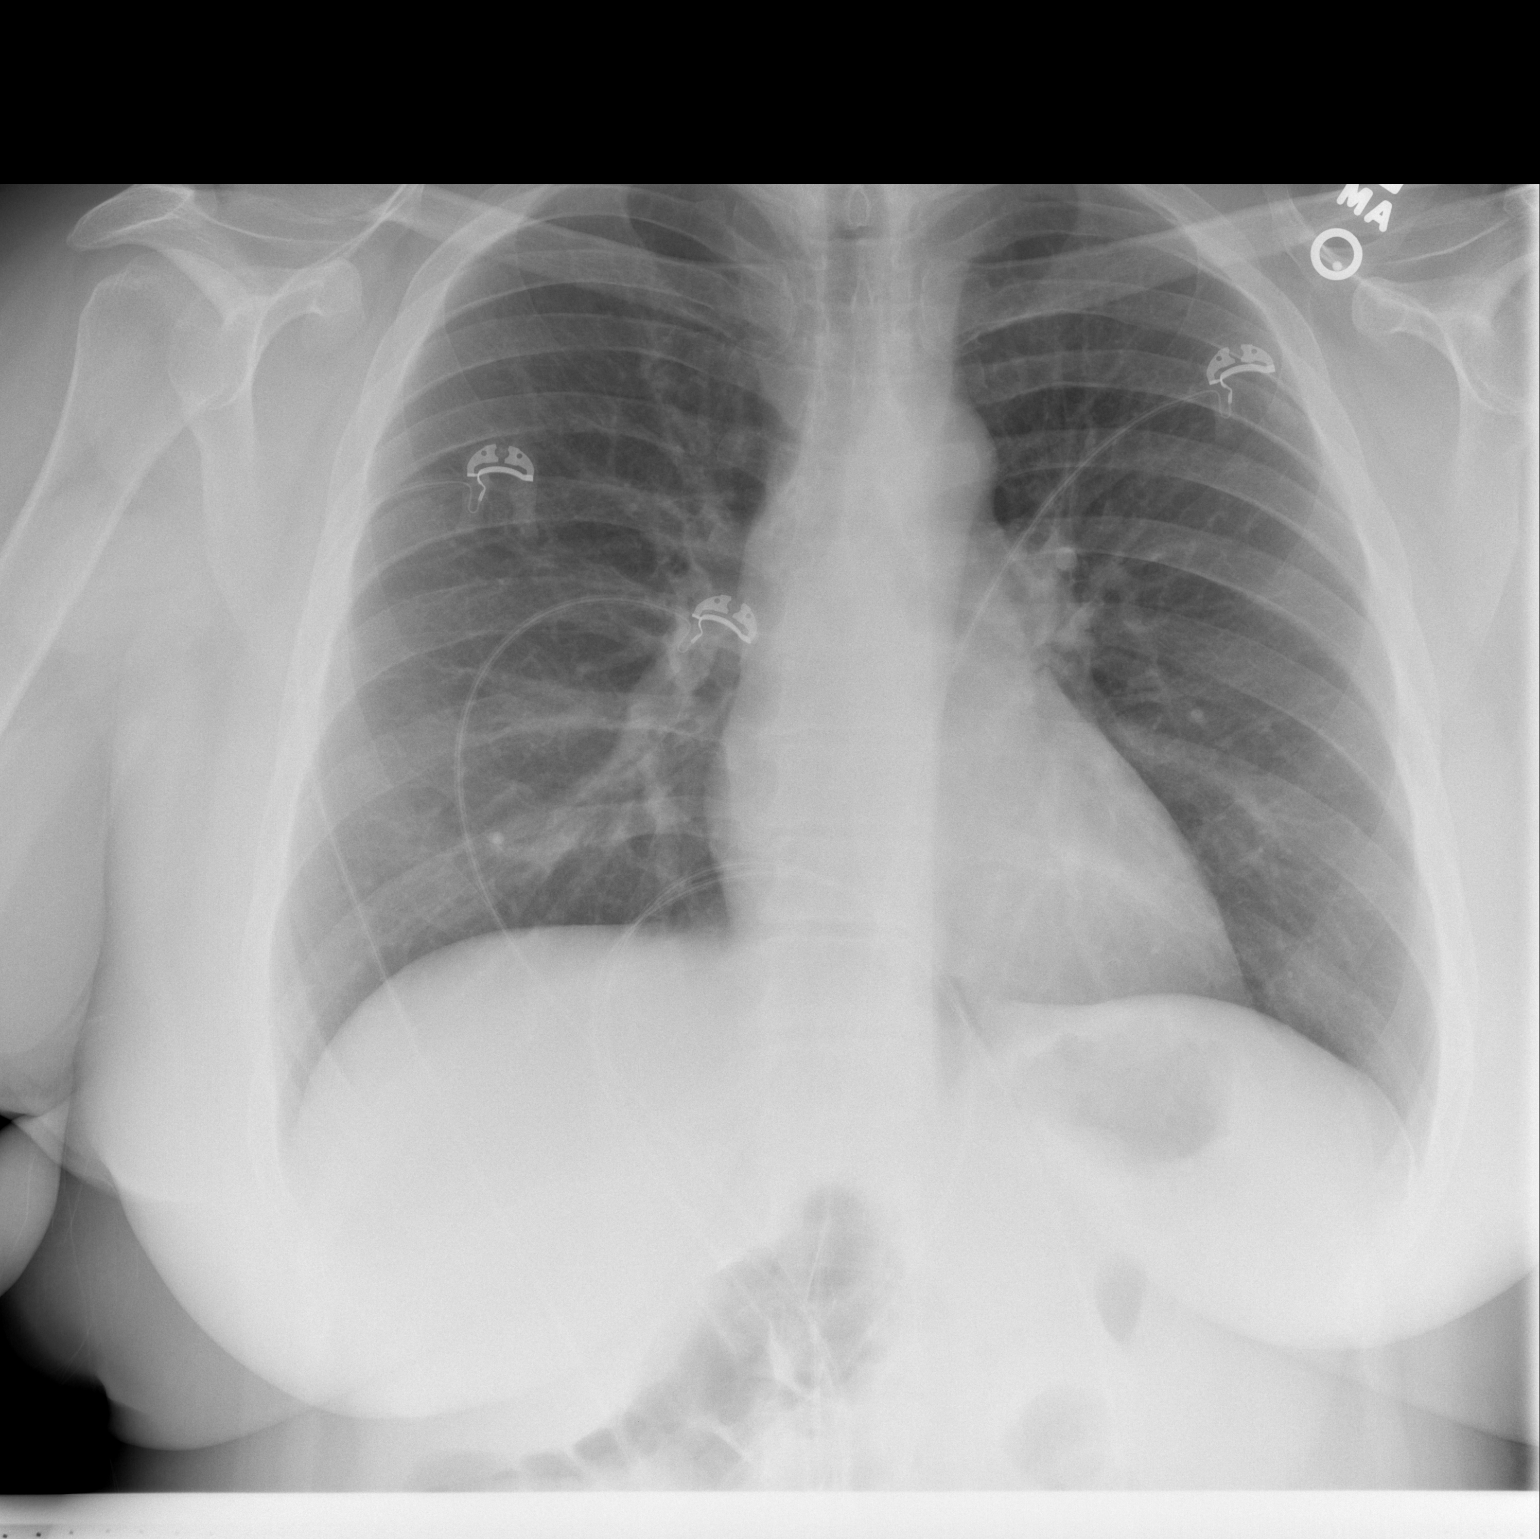

[w chest lat]
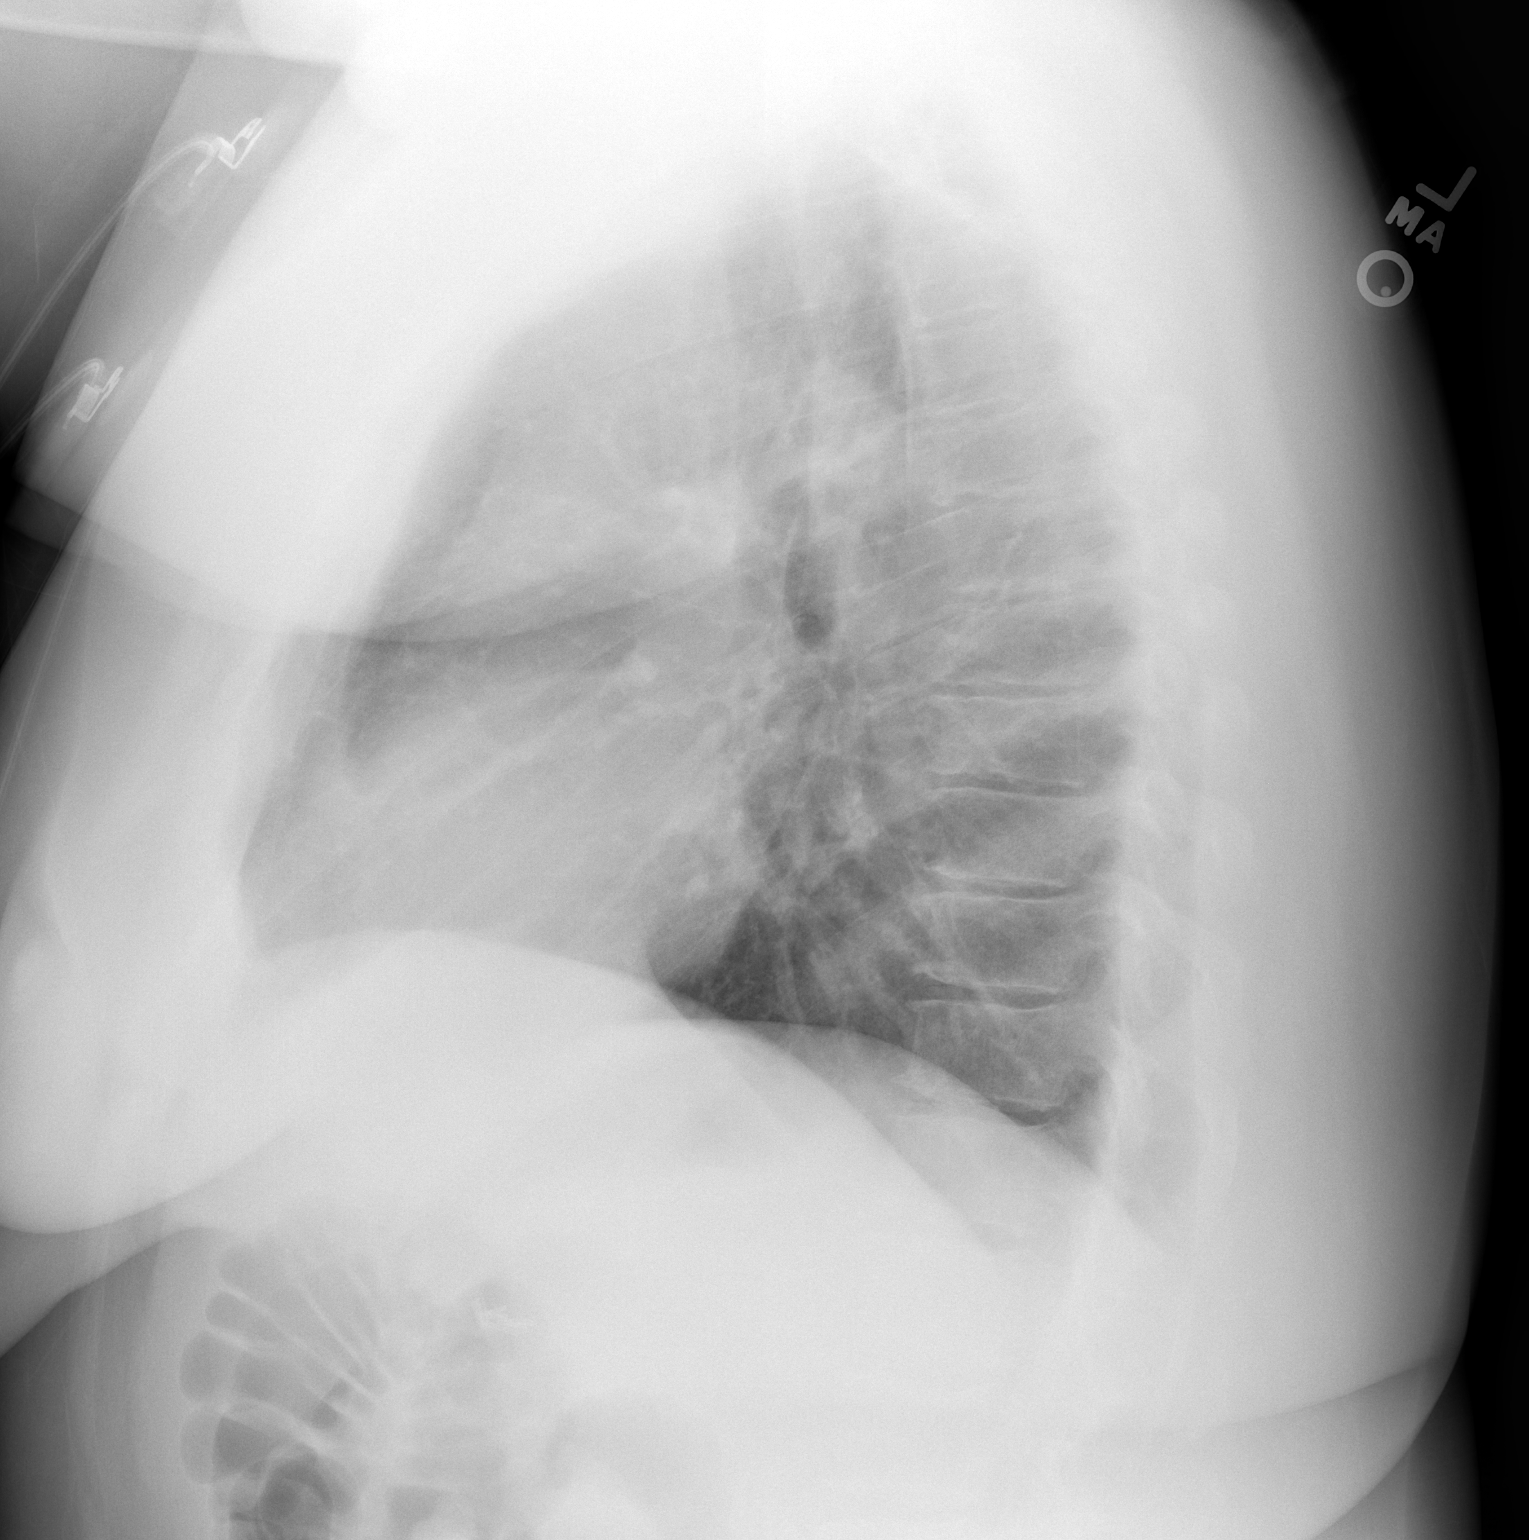

[2 of 2 positions shown; findings below may reference images not displayed]

FINDINGS: The heart size and mediastinal contours are within normal limits.
Both lungs are clear. The visualized skeletal structures are
unremarkable.
IMPRESSION: No active cardiopulmonary disease.
